# Patient Record
Sex: Male | Born: 1960 | Race: White | Hispanic: No
Health system: Southern US, Community
[De-identification: ages and names within clinical notes are randomized; demographics above are authoritative.]

---

## 2009-04-12 ENCOUNTER — Inpatient Hospital Stay (HOSPITAL_COMMUNITY): Admission: EM | Admit: 2009-04-12 | Discharge: 2009-04-17 | Payer: Self-pay | Admitting: Emergency Medicine

## 2009-05-05 ENCOUNTER — Ambulatory Visit (HOSPITAL_COMMUNITY): Admission: RE | Admit: 2009-05-05 | Discharge: 2009-05-05 | Payer: Self-pay | Admitting: Orthopedic Surgery

## 2010-10-11 LAB — BASIC METABOLIC PANEL
Chloride: 106 mEq/L (ref 96–112)
Creatinine, Ser: 0.81 mg/dL (ref 0.4–1.5)
GFR calc Af Amer: >60 mL/min (ref >60)
Potassium: 4.3 mEq/L (ref 3.5–5.1)
Sodium: 140 mEq/L (ref 135–145)

## 2010-10-11 LAB — HEMOGLOBIN A1C
Hgb A1c MFr Bld: 11.2 % — ABNORMAL HIGH (ref 4.6–6.1)
Mean Plasma Glucose: 275 mg/dL

## 2010-10-11 LAB — GLUCOSE, CAPILLARY
Glucose-Capillary: 157 mg/dL — ABNORMAL HIGH (ref 70–99)
Glucose-Capillary: 157 mg/dL — ABNORMAL HIGH (ref 70–99)
Glucose-Capillary: 164 mg/dL — ABNORMAL HIGH (ref 70–99)
Glucose-Capillary: 168 mg/dL — ABNORMAL HIGH (ref 70–99)
Glucose-Capillary: 170 mg/dL — ABNORMAL HIGH (ref 70–99)
Glucose-Capillary: 171 mg/dL — ABNORMAL HIGH (ref 70–99)
Glucose-Capillary: 176 mg/dL — ABNORMAL HIGH (ref 70–99)
Glucose-Capillary: 181 mg/dL — ABNORMAL HIGH (ref 70–99)
Glucose-Capillary: 188 mg/dL — ABNORMAL HIGH (ref 70–99)
Glucose-Capillary: 191 mg/dL — ABNORMAL HIGH (ref 70–99)
Glucose-Capillary: 193 mg/dL — ABNORMAL HIGH (ref 70–99)
Glucose-Capillary: 201 mg/dL — ABNORMAL HIGH (ref 70–99)
Glucose-Capillary: 211 mg/dL — ABNORMAL HIGH (ref 70–99)
Glucose-Capillary: 221 mg/dL — ABNORMAL HIGH (ref 70–99)
Glucose-Capillary: 228 mg/dL — ABNORMAL HIGH (ref 70–99)
Glucose-Capillary: 246 mg/dL — ABNORMAL HIGH (ref 70–99)
Glucose-Capillary: 249 mg/dL — ABNORMAL HIGH (ref 70–99)
Glucose-Capillary: 315 mg/dL — ABNORMAL HIGH (ref 70–99)

## 2010-10-11 LAB — DIFFERENTIAL
Basophils Relative: 0 % (ref 0–1)
Eosinophils Absolute: 0.1 10*3/uL (ref 0.0–0.7)
Monocytes Absolute: 1.2 10*3/uL — ABNORMAL HIGH (ref 0.1–1.0)
Neutro Abs: 14.2 10*3/uL — ABNORMAL HIGH (ref 1.7–7.7)
Neutrophils Relative %: 82 % — ABNORMAL HIGH (ref 43–77)

## 2010-10-11 LAB — CBC
HCT: 41.6 % (ref 39.0–52.0)
Hemoglobin: 14.6 g/dL (ref 13.0–17.0)
MCHC: 34.4 g/dL (ref 30.0–36.0)
MCHC: 35.1 g/dL (ref 30.0–36.0)
MCV: 90.4 fL (ref 78.0–100.0)
Platelets: 251 10*3/uL (ref 150–400)
RBC: 4.61 MIL/uL (ref 4.22–5.81)

## 2010-10-11 LAB — COMPREHENSIVE METABOLIC PANEL
ALT: 47 U/L (ref 0–53)
Alkaline Phosphatase: 62 U/L (ref 39–117)
Creatinine, Ser: 0.86 mg/dL (ref 0.4–1.5)
GFR calc non Af Amer: >60 mL/min (ref >60)
Potassium: 3.6 mEq/L (ref 3.5–5.1)
Sodium: 137 mEq/L (ref 135–145)
Total Bilirubin: 1.5 mg/dL — ABNORMAL HIGH (ref 0.3–1.2)

## 2010-10-11 LAB — URINALYSIS, ROUTINE W REFLEX MICROSCOPIC
Bilirubin Urine: NEGATIVE
Glucose, UA: >1000 mg/dL — AB
Ketones, ur: 15 mg/dL — AB
Leukocytes, UA: NEGATIVE
Protein, ur: NEGATIVE mg/dL
Specific Gravity, Urine: 1.044 — ABNORMAL HIGH (ref 1.005–1.030)
pH: 5 (ref 5.0–8.0)

## 2010-10-11 LAB — URINE MICROSCOPIC-ADD ON

## 2010-10-11 LAB — PROTIME-INR: INR: 0.97 (ref 0.00–1.49)
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
## 2017-03-12 ENCOUNTER — Ambulatory Visit: Payer: Self-pay | Admitting: "Endocrinology

## 2017-03-31 ENCOUNTER — Ambulatory Visit: Payer: Self-pay | Admitting: "Endocrinology

## 2017-04-22 ENCOUNTER — Ambulatory Visit: Payer: Self-pay | Admitting: "Endocrinology

## 2019-07-06 ENCOUNTER — Emergency Department (HOSPITAL_COMMUNITY)
Admission: EM | Admit: 2019-07-06 | Discharge: 2019-07-06 | Discharge status: Absent without leave | Payer: Medicaid - Out of State

## 2019-07-06 ENCOUNTER — Emergency Department (HOSPITAL_COMMUNITY)
Admission: EM | Admit: 2019-07-06 | Discharge: 2019-07-07 | Disposition: A | Discharge status: Home / Self Care | Payer: Medicaid - Out of State | Attending: Emergency Medicine | Admitting: Emergency Medicine

## 2019-07-06 ENCOUNTER — Other Ambulatory Visit: Payer: Self-pay

## 2019-07-06 DIAGNOSIS — Z760 Encounter for issue of repeat prescription: Secondary | ICD-10-CM | POA: Diagnosis not present

## 2019-07-06 DIAGNOSIS — Z992 Dependence on renal dialysis: Secondary | ICD-10-CM | POA: Insufficient documentation

## 2019-07-06 DIAGNOSIS — I120 Hypertensive chronic kidney disease with stage 5 chronic kidney disease or end stage renal disease: Secondary | ICD-10-CM | POA: Insufficient documentation

## 2019-07-06 DIAGNOSIS — N186 End stage renal disease: Secondary | ICD-10-CM | POA: Insufficient documentation

## 2019-07-06 DIAGNOSIS — I10 Essential (primary) hypertension: Secondary | ICD-10-CM

## 2019-07-06 DIAGNOSIS — Z79899 Other long term (current) drug therapy: Secondary | ICD-10-CM | POA: Insufficient documentation

## 2019-07-06 LAB — BASIC METABOLIC PANEL
Anion gap: 8 (ref 5–15)
BUN: 66 mg/dL — ABNORMAL HIGH (ref 6–20)
CO2: 25 mmol/L (ref 22–32)
Calcium: 8.6 mg/dL — ABNORMAL LOW (ref 8.9–10.3)
Chloride: 108 mmol/L (ref 98–111)
Creatinine, Ser: 2.46 mg/dL — ABNORMAL HIGH (ref 0.61–1.24)
GFR calc Af Amer: 32 mL/min — ABNORMAL LOW (ref >60)
GFR calc non Af Amer: 27 mL/min — ABNORMAL LOW (ref >60)
Glucose, Bld: 104 mg/dL — ABNORMAL HIGH (ref 70–99)
Potassium: 4.9 mmol/L (ref 3.5–5.1)
Sodium: 141 mmol/L (ref 135–145)

## 2019-07-06 LAB — CBC
HCT: 35.3 % — ABNORMAL LOW (ref 39.0–52.0)
Hemoglobin: 11 g/dL — ABNORMAL LOW (ref 13.0–17.0)
MCH: 30.1 pg (ref 26.0–34.0)
MCHC: 31.2 g/dL (ref 30.0–36.0)
MCV: 96.7 fL (ref 80.0–100.0)
Platelets: 176 10*3/uL (ref 150–400)
RBC: 3.65 MIL/uL — ABNORMAL LOW (ref 4.22–5.81)
RDW: 13.9 % (ref 11.5–15.5)
WBC: 7.9 10*3/uL (ref 4.0–10.5)
nRBC: 0 % (ref 0.0–0.2)

## 2019-07-06 LAB — CBG MONITORING, ED: Glucose-Capillary: 99 mg/dL (ref 70–99)

## 2019-07-06 MED ORDER — AMLODIPINE BESYLATE 5 MG PO TABS
10.0000 mg | ORAL_TABLET | Freq: Once | ORAL | Status: AC
  Administered 2019-07-06: 21:00:00 10 mg via ORAL
  Filled 2019-07-06: qty 2

## 2019-07-06 MED ORDER — AMLODIPINE BESYLATE 5 MG PO TABS: 10.0000 mg | ORAL_TABLET | Freq: Once | ORAL | Status: DC

## 2019-07-06 MED ORDER — HYDRALAZINE HCL 25 MG PO TABS: 25.0000 mg | ORAL_TABLET | Freq: Once | ORAL | Status: DC

## 2019-07-06 MED ORDER — HYDRALAZINE HCL 25 MG PO TABS
50.0000 mg | ORAL_TABLET | Freq: Once | ORAL | Status: AC
  Administered 2019-07-06: 50 mg via ORAL
  Filled 2019-07-06 (×2): qty 2

## 2019-07-06 NOTE — ED Provider Notes (Signed)
[facility] EMERGENCY DEPARTMENT Provider Note   CSN: [id number] Arrival date & time: 07/06/19  1757     History Chief Complaint  Patient presents with  . Hypertension    headache    Benjamin Prince is a 58 y.o. male.  HPI   This patient is a 58 year old male, he has a known history of hypertension, he has a history of diabetes, he is end-stage renal disease on dialysis and lives in [place].  He reports he has been out of his blood pressure medication for at least 1 week and over the week has noticed that his blood pressure seems to get much higher at nighttime, he is up above [id number] systolic, feeling generally weak but not focally weak, no other complaints including vomiting chest pain coughing or shortness of breath.  He has had his right lower extremity partially amputated, he is currently under the care of a physician in [place].  He reports that he is worried about his blood pressure which has been intermittently elevated, seeming to get worse over the week as he has been out of his medications.  States he cannot get them refilled until January, he cannot tell me why this is.  No past medical history on file.  Hypertension ESRD Amputatioin lower extremity BKA  There are no problems to display for this patient.  * The histories are not reviewed yet. Please review them in the "History" navigator section and refresh this [place].     No family history on file.  Social History   Tobacco Use  . Smoking status: Not on file  Substance Use Topics  . Alcohol use: Not on file  . Drug use: Not on file    Home Medications Prior to Admission medications   Medication Sig Start Date End Date Taking? Authorizing Provider  amLODipine (NORVASC) 10 MG tablet Take 1 tablet (10 mg total) by mouth daily. 123456   Delora Fuel, MD  hydrALAZINE (APRESOLINE) 25 MG tablet Take 1 tablet (25 mg total) by mouth 3 (three) times daily as needed (take up to every 8  hours for blood pressure reading over [id number] systolic). 123456   Delora Fuel, MD  oxyCODONE-acetaminophen (PERCOCET/ROXICET) 5-325 MG tablet Take 1 tablet by mouth every 4 (four) hours as needed for severe pain. 123456   Delora Fuel, MD    Allergies    Patient has no allergy information on record.  Review of Systems   Review of Systems  All other systems reviewed and are negative.   Physical Exam Updated Vital Signs BP (!) 175/78   Pulse 81   Temp 98.5 F (36.9 C) (Oral)   Resp 18   Ht 1.676 m (5\' 6" )   Wt 67.1 kg   SpO2 98%   BMI 23.89 kg/m   Physical Exam Vitals and nursing note reviewed.  Constitutional:      General: He is not in acute distress.    Appearance: He is well-developed.  HENT:     Head: Normocephalic and atraumatic.     Mouth/Throat:     Pharynx: No oropharyngeal exudate.  Eyes:     General: No scleral icterus.       Right eye: No discharge.        Left eye: No discharge.     Conjunctiva/sclera: Conjunctivae normal.     Pupils: Pupils are equal, round, and reactive to light.  Neck:     Thyroid: No thyromegaly.     Vascular: No JVD.  Cardiovascular:     Rate and Rhythm: Normal rate and regular rhythm.     Heart sounds: Normal heart sounds. No murmur. No friction rub. No gallop.   Pulmonary:     Effort: Pulmonary effort is normal. No respiratory distress.     Breath sounds: Normal breath sounds. No wheezing or rales.  Abdominal:     General: Bowel sounds are normal. There is no distension.     Palpations: Abdomen is soft. There is no mass.     Tenderness: There is no abdominal tenderness.  Musculoskeletal:        General: No tenderness. Normal range of motion.     Cervical back: Normal range of motion and neck supple.     Comments: Amputation as noted  Lymphadenopathy:     Cervical: No cervical adenopathy.  Skin:    General: Skin is warm and dry.     Findings: No erythema or rash.  Neurological:     Mental Status: He is alert.      Coordination: Coordination normal.     Comments: Able to use all 4 extremities, normal strength, normal mentation, normal level of alertness, no focal weakness  Psychiatric:        Behavior: Behavior normal.     ED Results / Procedures / Treatments   Labs (all labs ordered are listed, but only abnormal results are displayed) Labs Reviewed  CBC - Abnormal; Notable for the following components:      Result Value   RBC 3.65 (*)    Hemoglobin 11.0 (*)    HCT 35.3 (*)    All other components within normal limits  BASIC METABOLIC PANEL - Abnormal; Notable for the following components:   Glucose, Bld 104 (*)    BUN 66 (*)    Creatinine, Ser 2.46 (*)    Calcium 8.6 (*)    GFR calc non Af Amer 27 (*)    GFR calc Af Amer 32 (*)    All other components within normal limits  CBG MONITORING, ED    EKG  EKG performed on July 06, 2019 at 9:08 PM shows sinus rhythm, rate of 82 with normal axis, normal intervals, normal ST segments, there is a first-degree AV block with a PR interval of 246 ms, there are T wave inversions in lead III and aVF.  This is rather nonspecific, no old EKG to compare  Radiology No results found.  Procedures Procedures (including critical care time)  Medications Ordered in ED Medications  amLODipine (NORVASC) tablet 10 mg (10 mg Oral Given 07/06/19 2115)  hydrALAZINE (APRESOLINE) tablet 50 mg (50 mg Oral Given 07/06/19 2116)  acetaminophen (TYLENOL) tablet 650 mg (650 mg Oral Given 07/07/19 0120)    ED Course  I have reviewed the triage vital signs and the nursing notes.  Pertinent labs & imaging results that were available during my care of the patient were reviewed by me and considered in my medical decision making (see chart for details).    MDM Rules/Calculators/A&P                      Check EKG labs as he missed dialysis today, blood pressure is elevated, he will get a dose of his hydralazine and amlodipine, will recheck.  He is essentially  asymptomatic otherwise, states that he is able to go to dialysis tomorrow.  Final Clinical Impression(s) / ED Diagnoses Final diagnoses:  Hypertension, unspecified type  Medication refill    Rx /  DC Orders ED Discharge Orders         Ordered    amLODipine (NORVASC) 10 MG tablet  Daily,   Status:  Discontinued     07/07/19 0143    hydrALAZINE (APRESOLINE) 25 MG tablet  3 times daily PRN,   Status:  Discontinued     07/07/19 0143    oxyCODONE-acetaminophen (PERCOCET/ROXICET) 5-325 MG tablet  Every 4 hours PRN,   Status:  Discontinued     07/07/19 0143    amLODipine (NORVASC) 10 MG tablet  Daily     07/07/19 0250    hydrALAZINE (APRESOLINE) 25 MG tablet  3 times daily PRN     07/07/19 0250    oxyCODONE-acetaminophen (PERCOCET/ROXICET) 5-325 MG tablet  Every 4 hours PRN     07/07/19 0250           Noemi Chapel, MD 07/07/19 1024

## 2019-07-06 NOTE — ED Triage Notes (Signed)
Patient states hypertension x 2 days with headache and some blurred vision. Patient denies any weakness, no slurred speech. Patient states his blood pressure was reading high at home in the 200 range.

## 2019-07-06 NOTE — ED Notes (Signed)
Patient is out of both blood pressure medications that he is on at home. Dr Sabra Heck saw patient in triage. Home medications ordered in triage along with EKG and labs.

## 2019-07-07 MED ORDER — HYDRALAZINE HCL 25 MG PO TABS: 25.0000 mg | ORAL_TABLET | Freq: Three times a day (TID) | ORAL | 0 refills | Status: AC | PRN

## 2019-07-07 MED ORDER — HYDRALAZINE HCL 25 MG PO TABS: 25.0000 mg | ORAL_TABLET | Freq: Three times a day (TID) | ORAL | 0 refills | Status: DC | PRN

## 2019-07-07 MED ORDER — AMLODIPINE BESYLATE 10 MG PO TABS: 10.0000 mg | ORAL_TABLET | Freq: Every day | ORAL | 0 refills | Status: AC

## 2019-07-07 MED ORDER — ACETAMINOPHEN 325 MG PO TABS
650.0000 mg | ORAL_TABLET | Freq: Once | ORAL | Status: AC
  Administered 2019-07-07: 01:00:00 650 mg via ORAL
  Filled 2019-07-07: qty 2

## 2019-07-07 MED ORDER — OXYCODONE-ACETAMINOPHEN 5-325 MG PO TABS: 1.0000 | ORAL_TABLET | ORAL | 0 refills | Status: DC | PRN

## 2019-07-07 MED ORDER — OXYCODONE-ACETAMINOPHEN 5-325 MG PO TABS: 1.0000 | ORAL_TABLET | ORAL | 0 refills | Status: AC | PRN

## 2019-07-07 MED ORDER — AMLODIPINE BESYLATE 10 MG PO TABS: 10.0000 mg | ORAL_TABLET | Freq: Every day | ORAL | 0 refills | Status: DC

## 2019-07-07 NOTE — ED Provider Notes (Signed)
Pt care assumed from Dr. Sabra Heck, pending lab tests and bp treatment.  Labs reviewed and presumed stable, pt with h/o DM, HTN, ESRD on dialysis, to receive tomorrow, presenting with escalating bp's at home today, has been out of his bp meds since last week.  He is  one week out of rehab in [facility] for recovery from right lower extremity amputation who is confused about his dispo home from rehab, stating he was not prescribed medications, or if he was, never got the scripts or if called in, does not know where he is supposed to pick up.  He contacted his local pharmacy and was told he would not be able to pick up any prescriptions until January.    He has no chest pain, short of breath, palpitations.  He does endorse a mild frontal headache at this time.  No other complaints.  He was given his dose of hydralazine and amlodipine here.  Repeat blood pressure is improved at 179/76.  He was given a dose of Tylenol for his headache.  He was also prescribed hydralazine and amlodipine for ongoing treatment of his blood pressure until he can get his prescriptions straightened out with his providers.  He is scheduled for dialysis tomorrow, he was encouraged to discuss ongoing prescription needs with his nephrologist at that time.  Of note, he does have his Lantus.  He also reports intermittent pain in the amputated leg.  He was supposed have been prescribed oxycodone per his provided paperwork. Review of narcotic database - no prescriptions. He was given a pre pack of oxycodone - advised further pain meds to be provided by pcp.    Evalee Jefferson, PA-C 07/07/19 0149    Noemi Chapel, MD 07/07/19 1024

## 2019-07-07 NOTE — Discharge Instructions (Addendum)
Contact the provider that normally writes your prescriptions (either your primary MD or your kidney MD) tomorrow to arrange getting your other home medicines needed.  Take the amlodipine daily.  You may add the hydralazine, taking every 8 hours if needed for a blood pressure reading over [id number] systolic (the upper number).   Do not drive within 4 hours of taking the pain medicine you were given tonight - this will make you drowsy.

## 2021-07-10 DIAGNOSIS — Z992 Dependence on renal dialysis: Secondary | ICD-10-CM | POA: Diagnosis not present

## 2021-07-10 DIAGNOSIS — N2581 Secondary hyperparathyroidism of renal origin: Secondary | ICD-10-CM | POA: Diagnosis not present

## 2021-07-10 DIAGNOSIS — N186 End stage renal disease: Secondary | ICD-10-CM | POA: Diagnosis not present

## 2021-07-12 DIAGNOSIS — N2581 Secondary hyperparathyroidism of renal origin: Secondary | ICD-10-CM | POA: Diagnosis not present

## 2021-07-12 DIAGNOSIS — N186 End stage renal disease: Secondary | ICD-10-CM | POA: Diagnosis not present

## 2021-07-12 DIAGNOSIS — Z992 Dependence on renal dialysis: Secondary | ICD-10-CM | POA: Diagnosis not present

## 2021-07-14 DIAGNOSIS — N2581 Secondary hyperparathyroidism of renal origin: Secondary | ICD-10-CM | POA: Diagnosis not present

## 2021-07-14 DIAGNOSIS — N186 End stage renal disease: Secondary | ICD-10-CM | POA: Diagnosis not present

## 2021-07-14 DIAGNOSIS — Z992 Dependence on renal dialysis: Secondary | ICD-10-CM | POA: Diagnosis not present

## 2021-07-17 DIAGNOSIS — N2581 Secondary hyperparathyroidism of renal origin: Secondary | ICD-10-CM | POA: Diagnosis not present

## 2021-07-17 DIAGNOSIS — Z992 Dependence on renal dialysis: Secondary | ICD-10-CM | POA: Diagnosis not present

## 2021-07-17 DIAGNOSIS — N186 End stage renal disease: Secondary | ICD-10-CM | POA: Diagnosis not present

## 2021-07-19 DIAGNOSIS — N186 End stage renal disease: Secondary | ICD-10-CM | POA: Diagnosis not present

## 2021-07-19 DIAGNOSIS — Z992 Dependence on renal dialysis: Secondary | ICD-10-CM | POA: Diagnosis not present

## 2021-07-19 DIAGNOSIS — N2581 Secondary hyperparathyroidism of renal origin: Secondary | ICD-10-CM | POA: Diagnosis not present

## 2021-07-21 DIAGNOSIS — N186 End stage renal disease: Secondary | ICD-10-CM | POA: Diagnosis not present

## 2021-07-21 DIAGNOSIS — N2581 Secondary hyperparathyroidism of renal origin: Secondary | ICD-10-CM | POA: Diagnosis not present

## 2021-07-21 DIAGNOSIS — Z992 Dependence on renal dialysis: Secondary | ICD-10-CM | POA: Diagnosis not present

## 2021-07-24 DIAGNOSIS — Z992 Dependence on renal dialysis: Secondary | ICD-10-CM | POA: Diagnosis not present

## 2021-07-24 DIAGNOSIS — N2581 Secondary hyperparathyroidism of renal origin: Secondary | ICD-10-CM | POA: Diagnosis not present

## 2021-07-24 DIAGNOSIS — N186 End stage renal disease: Secondary | ICD-10-CM | POA: Diagnosis not present

## 2021-07-26 DIAGNOSIS — N186 End stage renal disease: Secondary | ICD-10-CM | POA: Diagnosis not present

## 2021-07-26 DIAGNOSIS — N2581 Secondary hyperparathyroidism of renal origin: Secondary | ICD-10-CM | POA: Diagnosis not present

## 2021-07-26 DIAGNOSIS — Z992 Dependence on renal dialysis: Secondary | ICD-10-CM | POA: Diagnosis not present

## 2021-07-28 DIAGNOSIS — Z992 Dependence on renal dialysis: Secondary | ICD-10-CM | POA: Diagnosis not present

## 2021-07-28 DIAGNOSIS — N186 End stage renal disease: Secondary | ICD-10-CM | POA: Diagnosis not present

## 2021-07-28 DIAGNOSIS — N2581 Secondary hyperparathyroidism of renal origin: Secondary | ICD-10-CM | POA: Diagnosis not present

## 2021-07-31 DIAGNOSIS — N2581 Secondary hyperparathyroidism of renal origin: Secondary | ICD-10-CM | POA: Diagnosis not present

## 2021-07-31 DIAGNOSIS — N186 End stage renal disease: Secondary | ICD-10-CM | POA: Diagnosis not present

## 2021-07-31 DIAGNOSIS — Z992 Dependence on renal dialysis: Secondary | ICD-10-CM | POA: Diagnosis not present

## 2021-08-01 DIAGNOSIS — E113212 Type 2 diabetes mellitus with mild nonproliferative diabetic retinopathy with macular edema, left eye: Secondary | ICD-10-CM | POA: Diagnosis not present

## 2021-08-01 DIAGNOSIS — H2511 Age-related nuclear cataract, right eye: Secondary | ICD-10-CM | POA: Diagnosis not present

## 2021-08-01 DIAGNOSIS — E113211 Type 2 diabetes mellitus with mild nonproliferative diabetic retinopathy with macular edema, right eye: Secondary | ICD-10-CM | POA: Diagnosis not present

## 2021-08-02 DIAGNOSIS — N186 End stage renal disease: Secondary | ICD-10-CM | POA: Diagnosis not present

## 2021-08-02 DIAGNOSIS — Z992 Dependence on renal dialysis: Secondary | ICD-10-CM | POA: Diagnosis not present

## 2021-08-02 DIAGNOSIS — N2581 Secondary hyperparathyroidism of renal origin: Secondary | ICD-10-CM | POA: Diagnosis not present

## 2021-08-04 DIAGNOSIS — N186 End stage renal disease: Secondary | ICD-10-CM | POA: Diagnosis not present

## 2021-08-04 DIAGNOSIS — Z992 Dependence on renal dialysis: Secondary | ICD-10-CM | POA: Diagnosis not present

## 2021-08-04 DIAGNOSIS — N2581 Secondary hyperparathyroidism of renal origin: Secondary | ICD-10-CM | POA: Diagnosis not present

## 2021-08-07 DIAGNOSIS — N2581 Secondary hyperparathyroidism of renal origin: Secondary | ICD-10-CM | POA: Diagnosis not present

## 2021-08-07 DIAGNOSIS — Z992 Dependence on renal dialysis: Secondary | ICD-10-CM | POA: Diagnosis not present

## 2021-08-07 DIAGNOSIS — N186 End stage renal disease: Secondary | ICD-10-CM | POA: Diagnosis not present

## 2023-06-15 ENCOUNTER — Encounter (HOSPITAL_COMMUNITY): Payer: Self-pay

## 2023-07-09 DEATH — deceased
